# Patient Record
Sex: Male | Born: 1995 | Hispanic: Yes | Smoking: Light tobacco smoker
Health system: Southern US, Community
[De-identification: ages and names within clinical notes are randomized; demographics above are authoritative.]

---

## 2020-10-06 ENCOUNTER — Other Ambulatory Visit: Payer: Self-pay

## 2020-10-06 ENCOUNTER — Emergency Department (HOSPITAL_COMMUNITY)
Admission: EM | Admit: 2020-10-06 | Discharge: 2020-10-06 | Disposition: A | Discharge status: Home / Self Care | Payer: PPO | Attending: Emergency Medicine | Admitting: Emergency Medicine

## 2020-10-06 ENCOUNTER — Encounter (HOSPITAL_COMMUNITY): Payer: Self-pay

## 2020-10-06 ENCOUNTER — Emergency Department (HOSPITAL_COMMUNITY): Payer: PPO

## 2020-10-06 DIAGNOSIS — Y9389 Activity, other specified: Secondary | ICD-10-CM | POA: Diagnosis not present

## 2020-10-06 DIAGNOSIS — R000 Tachycardia, unspecified: Secondary | ICD-10-CM | POA: Diagnosis not present

## 2020-10-06 DIAGNOSIS — S0181XA Laceration without foreign body of other part of head, initial encounter: Secondary | ICD-10-CM

## 2020-10-06 DIAGNOSIS — W500XXA Accidental hit or strike by another person, initial encounter: Secondary | ICD-10-CM | POA: Insufficient documentation

## 2020-10-06 DIAGNOSIS — S020XXA Fracture of vault of skull, initial encounter for closed fracture: Secondary | ICD-10-CM

## 2020-10-06 DIAGNOSIS — S0993XA Unspecified injury of face, initial encounter: Secondary | ICD-10-CM | POA: Diagnosis present

## 2020-10-06 DIAGNOSIS — F17200 Nicotine dependence, unspecified, uncomplicated: Secondary | ICD-10-CM | POA: Insufficient documentation

## 2020-10-06 DIAGNOSIS — S0219XA Other fracture of base of skull, initial encounter for closed fracture: Secondary | ICD-10-CM | POA: Diagnosis not present

## 2020-10-06 MED ORDER — LIDOCAINE-EPINEPHRINE-TETRACAINE (LET) TOPICAL GEL
3.0000 mL | Freq: Once | TOPICAL | Status: AC
  Administered 2020-10-06: 3 mL via TOPICAL
  Filled 2020-10-06: qty 3
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
## 2020-10-06 NOTE — ED Notes (Signed)
Pt ambulatory to the restroom.  

## 2020-10-06 NOTE — Discharge Instructions (Addendum)
Tylenol for pain.  Follow up if needed
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
## 2020-10-06 NOTE — ED Provider Notes (Signed)
Neurosurgery stated pt can be discharged and can fly today.  Pt sent home with disc of scan   Bethann Berkshire, MD 10/06/20 [phone]
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
